# Patient Record
Sex: Male | Born: 1970 | Race: White | Hispanic: No | Marital: Married | Smoking: Never smoker
Health system: Southern US, Community
[De-identification: ages and names within clinical notes are randomized; demographics above are authoritative.]

## PROBLEM LIST (undated history)

## (undated) DIAGNOSIS — M109 Gout, unspecified: Secondary | ICD-10-CM

## (undated) DIAGNOSIS — N200 Calculus of kidney: Secondary | ICD-10-CM

## (undated) HISTORY — DX: Gout, unspecified: M10.9

---

## 2004-02-20 ENCOUNTER — Emergency Department: Payer: Self-pay

## 2004-04-16 ENCOUNTER — Ambulatory Visit: Payer: Self-pay

## 2004-08-02 ENCOUNTER — Ambulatory Visit: Payer: Self-pay

## 2005-11-17 ENCOUNTER — Ambulatory Visit: Payer: Self-pay

## 2006-11-24 ENCOUNTER — Emergency Department: Payer: Self-pay

## 2007-09-05 ENCOUNTER — Emergency Department: Payer: Self-pay

## 2007-12-23 ENCOUNTER — Emergency Department: Payer: Self-pay

## 2010-01-01 ENCOUNTER — Ambulatory Visit (INDEPENDENT_AMBULATORY_CARE_PROVIDER_SITE_OTHER): Payer: BC Managed Care – PPO | Admitting: CITY URGENT CARE-WVUHE

## 2010-09-23 ENCOUNTER — Ambulatory Visit (INDEPENDENT_AMBULATORY_CARE_PROVIDER_SITE_OTHER): Payer: BC Managed Care – PPO

## 2010-09-23 ENCOUNTER — Encounter (INDEPENDENT_AMBULATORY_CARE_PROVIDER_SITE_OTHER): Payer: Self-pay

## 2010-09-23 VITALS — BP 152/95 | HR 88 | Temp 98.2°F | Resp 16 | Ht 76.5 in | Wt 262.0 lb

## 2010-09-23 DIAGNOSIS — J069 Acute upper respiratory infection, unspecified: Secondary | ICD-10-CM

## 2010-09-23 DIAGNOSIS — R030 Elevated blood-pressure reading, without diagnosis of hypertension: Secondary | ICD-10-CM

## 2010-09-23 MED ORDER — AZITHROMYCIN 250 MG TABLET
ORAL_TABLET | ORAL | Status: AC
Start: 2010-09-23 — End: 2010-09-28

## 2012-02-10 ENCOUNTER — Ambulatory Visit (INDEPENDENT_AMBULATORY_CARE_PROVIDER_SITE_OTHER): Admit: 2012-02-10 | Disposition: A | Discharge status: Home / Self Care | Payer: Self-pay | Source: Ambulatory Visit

## 2012-10-20 ENCOUNTER — Emergency Department: Payer: BC Managed Care – PPO

## 2012-10-20 ENCOUNTER — Emergency Department (HOSPITAL_BASED_OUTPATIENT_CLINIC_OR_DEPARTMENT_OTHER)
Admission: EM | Admit: 2012-10-20 | Discharge: 2012-10-20 | Disposition: A | Discharge status: Home / Self Care | Payer: BC Managed Care – PPO | Attending: Emergency Medicine | Admitting: Emergency Medicine

## 2012-10-20 ENCOUNTER — Encounter (HOSPITAL_BASED_OUTPATIENT_CLINIC_OR_DEPARTMENT_OTHER): Payer: Self-pay

## 2012-10-20 DIAGNOSIS — S62639A Displaced fracture of distal phalanx of unspecified finger, initial encounter for closed fracture: Secondary | ICD-10-CM | POA: Insufficient documentation

## 2012-10-20 DIAGNOSIS — S61209A Unspecified open wound of unspecified finger without damage to nail, initial encounter: Secondary | ICD-10-CM | POA: Insufficient documentation

## 2012-10-20 DIAGNOSIS — W3189XA Contact with other specified machinery, initial encounter: Secondary | ICD-10-CM | POA: Insufficient documentation

## 2012-10-20 DIAGNOSIS — S62639B Displaced fracture of distal phalanx of unspecified finger, initial encounter for open fracture: Secondary | ICD-10-CM | POA: Insufficient documentation

## 2012-10-20 DIAGNOSIS — Z23 Encounter for immunization: Secondary | ICD-10-CM | POA: Insufficient documentation

## 2012-10-20 HISTORY — DX: Calculus of kidney: N20.0

## 2012-10-20 MED ORDER — DIPHTH,PERTUSSIS(ACELL),TETANUS 2.5 LF UNIT-8 MCG-5 LF/0.5 ML IM SUSP
0.5000 mL | INTRAMUSCULAR | Status: AC
Start: 2012-10-20 — End: 2012-10-20
  Administered 2012-10-20: 0.5 mL via INTRAMUSCULAR
  Filled 2012-10-20: qty 0.5

## 2012-10-20 MED ORDER — CEPHALEXIN 500 MG CAPSULE
500.00 mg | ORAL_CAPSULE | ORAL | Status: AC
Start: 2012-10-20 — End: 2012-10-20

## 2012-10-20 MED ORDER — CEPHALEXIN 500 MG CAPSULE
ORAL_CAPSULE | ORAL | Status: AC
Start: 2012-10-20 — End: 2012-10-20
  Administered 2012-10-20: 500 mg via ORAL
  Filled 2012-10-20: qty 1

## 2012-10-20 MED ORDER — HYDROCODONE 5 MG-ACETAMINOPHEN 325 MG TABLET
ORAL_TABLET | ORAL | Status: AC
Start: 2012-10-20 — End: 2012-10-20
  Administered 2012-10-20: 1 via ORAL
  Filled 2012-10-20: qty 1

## 2012-10-20 MED ORDER — CEPHALEXIN 500 MG CAPSULE
500.00 mg | ORAL_CAPSULE | Freq: Four times a day (QID) | ORAL | Status: AC
Start: 2012-10-20 — End: 2012-10-30

## 2012-10-20 MED ORDER — HYDROCODONE 5 MG-ACETAMINOPHEN 325 MG TABLET
1.00 | ORAL_TABLET | Freq: Three times a day (TID) | ORAL | Status: DC | PRN
Start: 2012-10-20 — End: 2013-02-18

## 2012-10-20 MED ORDER — HYDROCODONE 5 MG-ACETAMINOPHEN 325 MG TABLET
1.00 | ORAL_TABLET | ORAL | Status: AC
Start: 2012-10-20 — End: 2012-10-20

## 2012-10-20 NOTE — Discharge Instructions (Signed)
Fingertip Laceration  The treatment of fingertip injuries depends on how large the cut is and whether the bone or nail tissue has been damaged. Amputations of the skin over the tip of the finger that is smaller than a dime (smaller than 1cm) will usually heal very well from the sides without any treatment other than cleaning the wound and changing the dressing.  Keep your hand elevated for the next 2 to 3 days to reduce pain and swelling. A splint over the fingertip may be needed to protect your injury. If your cut is being allowed to heal in from the sides, you should soak it in warm water and change the dressing daily.   You may need a tetanus shot if:   You cannot remember when you had your last tetanus shot.   You have never had a tetanus shot.   The injury broke your skin.  If you got a tetanus shot, your arm may swell, get red, and feel warm to the touch. This is common and not a problem. If you need a tetanus shot and you choose not to have one, there is a rare chance of getting tetanus. Sickness from tetanus can be serious.  SEEK MEDICAL CARE IF:    There are any signs of infection: increased redness, swelling, and pain, or sometimes pus drainage.  Document Released: 12/07/2004 Document Revised: 01/22/2012 Document Reviewed: 12/03/2008  [facility] Patient Information 2013 [place].  Finger Fracture  Fractures of fingers are breaks in the bones of the fingers. There are many types of fractures. There are different ways of treating these fractures, all of which can be correct. Your caregiver will discuss the best way to treat your fracture.  TREATMENT   Finger fractures can be treated with:    Non-reduction - this means the bones are in place. The finger is splinted without changing the positions of the bone pieces. The splint is usually left on for about a week to ten days. This will depend on your fracture and what your caregiver thinks.   Closed reduction - the bones are put back into position  without using surgery. The finger is then splinted.   ORIF (open reduction and internal fixation) - the fracture site is opened. Then the bone pieces are fixed into place with pins or some type of hardware. This is seldom required. It depends on the severity of the fracture.  Your caregiver will discuss the type of fracture you have and the treatment that will be best for that problem. If surgery is the treatment of choice, the following is information for you to know and also let your caregiver know about prior to surgery.  LET YOUR CAREGIVER KNOW ABOUT:   Allergies   Medications taken including herbs, eye drops, over the counter medications, and creams   Use of steroids (by mouth or creams)   Previous problems with anesthetics or Novocaine   Possibility of pregnancy, if this applies   History of blood clots (thrombophlebitis)   History of bleeding or blood problems   Previous surgery   Other health problems  AFTER THE PROCEDURE  After surgery, you will be taken to the recovery area where a nurse will check your progress. Once you're awake, stable, and taking fluids well, barring other problems you will be allowed to go home. Once home an ice pack applied to your operative site may help with discomfort and keep the swelling down.  HOME CARE INSTRUCTIONS    Follow your caregiver's  instructions as to activities, exercises, physical therapy, and driving a car.   Use your finger and exercise as directed.   Only take over-the-counter or prescription medicines for pain, discomfort, or fever as directed by your caregiver. Do not take aspirin until your caregiver OK's it, as this can increase bleeding immediately following surgery.   Stop using ibuprofen if it upsets your stomach. Let your caregiver know about it.  SEEK MEDICAL CARE IF:   You have increased bleeding (more than a small spot) from the wound or from beneath your splint.   You develop redness, swelling, or increasing pain in the wound or from  beneath your splint.   There is pus coming from the wound or from beneath your splint.   An unexplained oral temperature above 102 F (38.9 C) develops, or as your caregiver suggests.   There is a foul smell coming from the wound or dressing or from beneath your splint.  SEEK IMMEDIATE MEDICAL CARE IF:    You develop a rash.   You have difficulty breathing.   You have any allergic problems.  MAKE SURE YOU:    Understand these instructions.   Will watch your condition.   Will get help right away if you are not doing well or get worse.  Document Released: 02/11/2001 Document Revised: 01/22/2012 Document Reviewed: 06/18/2008  [facility] Patient Information 2013 [place].

## 2012-10-20 NOTE — ED Nurses Note (Signed)
xlg sling applied with instruction of use

## 2012-10-20 NOTE — ED Nurses Note (Signed)
Vaccination info and vaccine card given to pt.

## 2012-10-20 NOTE — ED Nurses Note (Signed)
Pt to x ray with ns soaked 4x4 to area after block done by PA.

## 2012-10-20 NOTE — ED Nurses Note (Signed)
Written and verbal discharge instructions given to pt, they verbalize understanding. Pt ambulated to the exit.

## 2013-02-18 ENCOUNTER — Encounter (HOSPITAL_BASED_OUTPATIENT_CLINIC_OR_DEPARTMENT_OTHER): Payer: Self-pay

## 2013-02-18 ENCOUNTER — Emergency Department (HOSPITAL_BASED_OUTPATIENT_CLINIC_OR_DEPARTMENT_OTHER)
Admission: EM | Admit: 2013-02-18 | Discharge: 2013-02-18 | Disposition: A | Discharge status: Home / Self Care | Payer: Worker's Compensation | Attending: Emergency Medicine | Admitting: Emergency Medicine

## 2013-02-18 ENCOUNTER — Emergency Department (HOSPITAL_BASED_OUTPATIENT_CLINIC_OR_DEPARTMENT_OTHER): Payer: Worker's Compensation

## 2013-02-18 DIAGNOSIS — M7989 Other specified soft tissue disorders: Secondary | ICD-10-CM | POA: Insufficient documentation

## 2013-02-18 DIAGNOSIS — S52599A Other fractures of lower end of unspecified radius, initial encounter for closed fracture: Secondary | ICD-10-CM | POA: Insufficient documentation

## 2013-02-18 DIAGNOSIS — X500XXA Overexertion from strenuous movement or load, initial encounter: Secondary | ICD-10-CM | POA: Insufficient documentation

## 2013-02-18 NOTE — ED Nurses Note (Signed)
 Called RAD. Not ready for pt at this time.

## 2013-02-18 NOTE — ED Nurses Note (Signed)
 Injured lt. Wrist getting out of his truck

## 2013-02-18 NOTE — ED Nurses Note (Signed)
Pt injured left wrist, c/o pain to dorsal aspect of left wrist, xray completed, ed tech to apply volar splint

## 2013-02-18 NOTE — ED Nurses Note (Signed)
Patient discharged home with family.  AVS reviewed with patient/care giver.  A written copy of the AVS and discharge instructions was given to the patient/care giver.  Questions sufficiently answered as needed.  Patient/care giver encouraged to follow up with PCP as indicated.  In the event of an emergency, patient/care giver instructed to call 911 or go to the nearest emergency room.   Volar splint applied, reviewed instructions with pt for follow up, pt verbalized understanding of instructions, pt able to move fingers with splint applied no loss of sensation or circulation evident. Pt ambulatory from dept

## 2013-02-18 NOTE — ED Provider Notes (Signed)
 Janus Ozell RAMAN, MD  [facility]  Emergency Department Visit Note    Date:  02/18/2013  Primary care provider:  None Given  Means of arrival:  private car  History obtained from: patient  History limited by: none    Chief Complaint:  Wrist injury    HISTORY OF PRESENT ILLNESS     Benjamin Delgado, date of birth 1971/04/12, is a 42 y.o. male who presents to the Emergency Department post wrist injury. The patient states he was getting out of his work truck, and injured his left wrist. He states while climbing out of his truck I caught my left hand behind me, and they bent backwards and pulled my arm. Honestly, I have no idea how I did it. He states he is now experiencing pain and swelling to his left wrist and hand. He states he is right hand dominant.     REVIEW OF SYSTEMS     The pertinent positive and negative symptoms are as per HPI. All other systems reviewed and are negative.     PATIENT HISTORY     Past Medical History:  Past Medical History   Diagnosis Date   . Kidney stone        Past Surgical History:  History reviewed. No pertinent past surgical history.    Family History:  No family history of acute illness pertaining to the current visit given at this time.     Social History:  History   Substance Use Topics   . Smoking status: Never Smoker    . Smokeless tobacco: Not on file   . Alcohol Use: No     History   Drug Use No       Medications:  Previous Medications    No medications on file       Allergies:  Allergies   Allergen Reactions   . Tetanus Vaccines & Toxoid Nausea/ Vomiting       PHYSICAL EXAM     Vitals:  Filed Vitals:    02/18/13 0558   BP: 131/90   Pulse: 80   Temp: 36.5 C (97.7 F)   Resp: 16   SpO2: 99%       Pulse ox  99% on None (Room Air) interpreted by me as: Normal    Constitutional: The patient is alert and oriented to person, place, and time. Well-developed and well-nourished.  HENT: Atraumatic, normocephalic head. Mucous membranes moist. TM's clear, Nares  unremarkable. Oropharynx shows no erythema or exudate.   Eyes: Pupils equal and round, reactive to light. No scleral icterus. Normal conjunctiva. Extraocular movements are intact.  Neck: Supple, non-tender, no nuchal rigidity, no adenopathy.   Lungs: Clear to auscultation bilaterally. Symmetric and equal expansion. No respiratory distress or retractions.  Cardiovascular: Heart is S1-S2 regular rate and regular rhythm without murmur click or rub.  Abdomen:  Soft, non-distended. No tenderness to palpation without evidence of rebound or guarding. No pulsatile masses. No organomegaly.   Genitourinary: No CVA tenderness.  Extremities: Full range of motion, no clubbing, cyanosis, or edema. Pulses 2+, capillary refill <2 seconds. Tenderness to palpation of the dorsum of his left wrist, worse with pronation, supination, and hand extension. Neurovascularly intact.   Spine: No midline or paraspinal muscle tenderness to palpation. No step-off.   Skin: Warm and dry. No cyanosis, jaundice, rash or lesion.  Neurologic: Alert and oriented x3. Normal facial symmetry and speech, Normal upper and lower extremity strength, and grossly normal sensation.  DIAGNOSTIC STUDIES     Radiology:    XR WRIST LEFT SERIES, 5-VIEWS: Questionable lucency on the distal radial head.   Interpreted by me.    ED PROGRESS NOTE / MEDICAL DECISION MAKING     Old records reviewed by me:  I have reviewed the patient's relevant previous records.       Orders Placed This Encounter   . XR WRIST LEFT SERIES       Left wrist x-ray series ordered.    Differential diagnosis: fracture vs dislocation vs contusion vs muscle strain.     6:21 AM - I have explained the results of the diagnostic studies that revealed questionable lucency on the distal radial head. He understands the possibility that these findings may or may not represent a fracture, and is comfortable treating his injury as if it were a fracture. The patient had a volar splint applied to his left  wrist. I have discussed the diagnosis, disposition, and follow-up plan. Return precautions to the Emergency Department were discussed. He understood and is in accordance with the treatment plan at this time. All of his questions have been answered to his satisfaction. The patient is in stable condition at the time of discharge.     6:34 AM Volar splint adequately applied by Reynolds B. (Tech). The patient is neurovascularly intact.        Pre-Disposition Vitals:  Filed Vitals:    02/18/13 0558   BP: 131/90   Pulse: 80   Temp: 36.5 C (97.7 F)   Resp: 16   SpO2: 99%       CLINICAL IMPRESSION     Encounter Diagnosis   Name Primary?   . Distal radius fracture Yes     DISPOSITION/PLAN     Discharged        Prescriptions:     No medications were prescribed during this visit.      Follow-Up:     Arvell Norleen LABOR, MD  [address]  [place] [id number]  [phone]    Call in 2 days        Condition at Disposition: Stable        SCRIBE ATTESTATION STATEMENT  I Ray Chandler, SCRIBE scribed for Janus Ozell RAMAN, MD on 02/18/2013 at 6:17 AM.     Documentation assistance provided for Janus Ozell RAMAN, MD  by Ray Chandler, SCRIBE. Information recorded by the scribe was done at my direction and has been reviewed and validated by me Janus Ozell RAMAN, MD.

## 2013-03-03 ENCOUNTER — Ambulatory Visit: Admit: 2013-03-03 | Payer: Self-pay | Source: Ambulatory Visit | Attending: Family Medicine | Admitting: Family Medicine

## 2013-03-03 ENCOUNTER — Ambulatory Visit
Admission: RE | Admit: 2013-03-03 | Disposition: A | Discharge status: Home / Self Care | Payer: Self-pay | Source: Ambulatory Visit | Admitting: Diagnostic Radiology

## 2013-03-04 ENCOUNTER — Ambulatory Visit
Admit: 2013-03-04 | Disposition: A | Discharge status: Home / Self Care | Payer: Self-pay | Source: Ambulatory Visit | Admitting: Family Medicine

## 2014-06-10 ENCOUNTER — Ambulatory Visit
Admission: RE | Admit: 2014-06-10 | Discharge: 2014-06-10 | Disposition: A | Discharge status: Home / Self Care | Payer: BC Managed Care – PPO | Source: Ambulatory Visit | Attending: Internal Medicine | Admitting: Internal Medicine

## 2014-06-10 DIAGNOSIS — M109 Gout, unspecified: Secondary | ICD-10-CM | POA: Insufficient documentation

## 2014-06-10 LAB — COMPREHENSIVE METABOLIC PROFILE - BMC/JMC ONLY
ALBUMIN/GLOBULIN RATIO: 1.6
ALBUMIN: 4.7 g/dL (ref 3.2–5.0)
ALKALINE PHOSPHATASE: 56 IU/L (ref 35–120)
ALT (SGPT): 35 IU/L (ref 0–63)
ANION GAP: 11 mmol/L (ref 3–11)
AST (SGOT): 28 IU/L (ref 0–45)
BILIRUBIN, TOTAL: 0.6 mg/dL (ref 0.0–1.3)
BUN: 16 mg/dL (ref 6–22)
CALCIUM: 10 mg/dL (ref 8.5–10.5)
CARBON DIOXIDE: 26 mmol/L (ref 22–32)
CHLORIDE: 104 mmol/L (ref 101–111)
CREATININE: 1.01 mg/dL (ref 0.72–1.30)
ESTIMATED GLOMERULAR FILTRATION RATE: >60 mL/min (ref >60)
GLUCOSE: 104 mg/dL (ref 70–110)
POTASSIUM: 4.7 mmol/L (ref 3.5–5.0)
SODIUM: 141 mmol/L (ref 136–145)
TOTAL PROTEIN: 7.5 g/dL (ref 6.0–8.0)

## 2014-06-10 LAB — URIC ACID: URIC ACID: 6.3 mg/dL (ref 4.0–7.0)

## 2014-09-21 ENCOUNTER — Encounter (HOSPITAL_BASED_OUTPATIENT_CLINIC_OR_DEPARTMENT_OTHER): Payer: Self-pay

## 2014-09-21 ENCOUNTER — Emergency Department (HOSPITAL_BASED_OUTPATIENT_CLINIC_OR_DEPARTMENT_OTHER)
Admission: EM | Admit: 2014-09-21 | Discharge: 2014-09-21 | Disposition: A | Discharge status: Home / Self Care | Payer: BC Managed Care – PPO | Attending: Emergency Medicine | Admitting: Emergency Medicine

## 2014-09-21 DIAGNOSIS — W57XXXA Bitten or stung by nonvenomous insect and other nonvenomous arthropods, initial encounter: Secondary | ICD-10-CM | POA: Insufficient documentation

## 2014-09-21 DIAGNOSIS — S70362A Insect bite (nonvenomous), left thigh, initial encounter: Secondary | ICD-10-CM | POA: Insufficient documentation

## 2014-09-21 MED ORDER — DOXYCYCLINE HYCLATE 100 MG TABLET
200.00 mg | ORAL_TABLET | ORAL | Status: AC
Start: 2014-09-21 — End: 2014-09-21

## 2014-09-21 MED ORDER — DOXYCYCLINE HYCLATE 100 MG TABLET
ORAL_TABLET | ORAL | Status: AC
Start: 2014-09-21 — End: 2014-09-21
  Administered 2014-09-21: 200 mg via ORAL
  Filled 2014-09-21: qty 2

## 2014-09-21 MED ADMIN — nystatin 100,000 unit/gram topical powder: ORAL | @ 02:00:00 | NDC 00574200815

## 2014-09-21 NOTE — ED Nurses Note (Signed)
Pt complaining of a tick bite to his left thigh.

## 2014-09-21 NOTE — ED Nurses Note (Signed)
Current Discharge Medication List      Notice     You have not been prescribed any medications.      Patient discharged home with family.  AVS reviewed with patient/care giver.  A written copy of the AVS and discharge instructions was given to the patient/care giver.  Questions sufficiently answered as needed.  Patient/care giver encouraged to follow up with PCP as indicated.  In the event of an emergency, patient/care giver instructed to call 911 or go to the nearest emergency room.   Pt in NAD and has equal and unlabored respirations.  Pt ambulated to lobby by self

## 2014-09-21 NOTE — ED Provider Notes (Signed)
Kara PacerLondner, Kylie Gros S, MD  [facility]  Emergency Department Visit Note    Date:  09/21/2014  Primary care provider:  No Established Pcp  Means of arrival:  private car  History obtained from: patient  History limited by: none    Chief Complaint:  Tick bite     HISTORY OF PRESENT ILLNESS     Benjamin Delgado, date of birth 12/16/1970, is a 43 y.o. male who presents to the Emergency Department post tick bite. The patient states that Saturday evening (09/19/14) he was bit on the left thigh by a tick. He states "it was one of the little ones you're supposed to look out for, and it left a red bullseye spot where I was bit, and I wanted to come have it looked out." He denies any headaches, joint pain, or other symptoms at this time.     REVIEW OF SYSTEMS     The pertinent positive and negative symptoms are as per HPI. All other systems reviewed and are negative.     PATIENT HISTORY     Past Medical History:  Past Medical History   Diagnosis Date    Kidney stone        Past Surgical History:  History reviewed. No pertinent past surgical history.    Family History:  No family history of acute illness pertaining to the current visit given at this time.     Social History:  History   Substance Use Topics    Smoking status: Never Smoker     Smokeless tobacco: Not on file    Alcohol Use: No     History   Drug Use No       Medications:  Previous Medications    No medications on file       Allergies:  Allergies   Allergen Reactions    Tetanus Vaccines & Toxoid Nausea/ Vomiting       PHYSICAL EXAM     Vitals:  Filed Vitals:    09/21/14 0127   BP: 156/89   Pulse: 75   Temp: 36.7 C (98.1 F)   Resp: 16   SpO2: 99%       Pulse ox  99% on None (Room Air) interpreted by me as: Normal    Constitutional: The patient is alert and oriented to person, place, and time. Well-developed and well-nourished.  HENT: Atraumatic, normocephalic head. Mucous membranes moist. Nares unremarkable. Oropharynx shows no erythema or exudate.      Eyes: Pupils equal and round, reactive to light. No scleral icterus. Normal conjunctiva. Extraocular movements are intact.  Neck: Supple, non-tender, no nuchal rigidity, no adenopathy.   Lungs: Clear to auscultation bilaterally. Symmetric and equal expansion. No respiratory distress or retractions.  Cardiovascular: Heart is S1-S2 regular rate and regular rhythm without murmur click or rub.  Abdomen:  Soft, non-distended. No tenderness to palpation without evidence of rebound or guarding. No pulsatile masses. No organomegaly.   Extremities: Full range of motion, no clubbing, cyanosis, or edema. Pulses 2+, capillary refill <2 seconds.  Spine: No midline or paraspinal muscle tenderness to palpation. No step-off.   Skin: Warm and dry. No cyanosis, jaundice, rash or lesion. Small tick bite on the left lateral thigh.   Neurologic: Alert and oriented x3. Normal facial symmetry and speech, Normal upper and lower extremity strength, and grossly normal sensation.     DIFFERENTIAL DIAGNOSES      Tick bite    Cellulitis    Lyme exposure  ED PROGRESS NOTE / MEDICAL DECISION MAKING     Old records reviewed by me:  I have reviewed the patient's recent past medical history. Nurse's notes reviewed.      Orders Placed This Encounter    doxycycline tablet       Patient was initially treated with Doxycycline PO.      1:46 AM - Initial evaluation completed at this time.     1:47 AM - I have discussed the diagnosis, disposition, and follow-up plan. Return precautions to the Emergency Department including: red streaking from the bite, headache, or joint pain were discussed. The patient understood and is in accordance with the treatment plan at this time. All of his questions have been answered to his satisfaction. The patient is in stable condition at the time of discharge.         Pre-Disposition Vitals:  Filed Vitals:    09/21/14 0127   BP: 156/89   Pulse: 75   Temp: 36.7 C (98.1 F)   Resp: 16   SpO2: 99%       CLINICAL  IMPRESSION     Encounter Diagnosis   Name Primary?    Tick bite of thigh, left, initial encounter Yes     DISPOSITION/PLAN     Discharged        Prescriptions:     No medications were prescribed during this visit.      [facility]Up:     [facility]  [address]  [place] [id number]  [phone]    As needed      Condition at Disposition: Stable        SCRIBE ATTESTATION STATEMENT  I Micah FlesherSimon Colie, SCRIBE scribed for Kara PacerLondner, Terrea Bruster S, MD on 09/21/2014 at 1:43 AM.     Documentation assistance provided for Kara PacerLondner, Tamel Abel S, MD  by Micah FlesherSimon Colie, SCRIBE. Information recorded by the scribe was done at my direction and has been reviewed and validated by me Kara PacerLondner, Nylan Nakatani S, MD.

## 2014-12-27 ENCOUNTER — Encounter (INDEPENDENT_AMBULATORY_CARE_PROVIDER_SITE_OTHER): Payer: Self-pay | Admitting: Physician Assistant

## 2014-12-27 ENCOUNTER — Ambulatory Visit (INDEPENDENT_AMBULATORY_CARE_PROVIDER_SITE_OTHER): Payer: BLUE CROSS/BLUE SHIELD | Admitting: Physician Assistant

## 2014-12-27 VITALS — BP 140/89 | HR 80 | Temp 98.0°F | Resp 18 | Ht 76.0 in | Wt 271.5 lb

## 2014-12-27 DIAGNOSIS — H6692 Otitis media, unspecified, left ear: Secondary | ICD-10-CM

## 2014-12-27 DIAGNOSIS — J029 Acute pharyngitis, unspecified: Secondary | ICD-10-CM

## 2014-12-27 DIAGNOSIS — H6092 Unspecified otitis externa, left ear: Secondary | ICD-10-CM

## 2014-12-27 LAB — POCT RAPID STREP A: Rapid Strep A Screen POCT: NEGATIVE

## 2014-12-27 MED ORDER — NEOMYCIN-POLYMYXIN-HC 3.5-10000-1 OT SOLN
3.0000 [drp] | Freq: Four times a day (QID) | OTIC | Status: AC
Start: 2014-12-27 — End: ?

## 2014-12-27 MED ORDER — AZITHROMYCIN 250 MG PO TABS
ORAL_TABLET | ORAL | Status: DC
Start: 2014-12-27 — End: 2018-02-28

## 2014-12-27 NOTE — Progress Notes (Signed)
Subjective:    Patient ID: Melvin Welch is a 44 y.o. male.    Otalgia   There is pain in the left ear. This is a chronic problem. The current episode started more than 1 month ago. The problem occurs constantly. The problem has been unchanged. There has been no fever. The pain is moderate. Associated symptoms include a sore throat. Pertinent negatives include no coughing, hearing loss, rash or rhinorrhea. He has tried antibiotics (has tried two diff abx amoxicillin and augmentin from [facility]) for the symptoms.     Allergies   Allergen Reactions   . Aspirin Nausea And Vomiting       No past medical history on file.    No current outpatient prescriptions on file prior to visit.     No current facility-administered medications on file prior to visit.       The following portions of the patient's history were reviewed and updated as appropriate: allergies, current medications, past family history, past medical history, past social history, past surgical history and problem list.    Review of Systems   Constitutional: Negative for fever.   HENT: Positive for ear pain and sore throat. Negative for hearing loss and rhinorrhea.    Respiratory: Negative for cough, chest tightness, wheezing and stridor.    Cardiovascular: Negative for chest pain.   Skin: Negative for rash.         Objective:    BP 140/89 mmHg  Pulse 80  Temp(Src) 98 F (36.7 C) (Oral)  Resp 18  Ht 1.93 m (6\' 4" )  Wt 123.152 kg (271 lb 8 oz)  BMI 33.06 kg/m2    Vital signs were checked and discussed with the patient. Appropriate measures have been taken. The patient will follow up with primary care physician for further diagnostic evaluation and treatment if there are any concerns.     Physical Exam   Constitutional: He appears well-developed and well-nourished. No distress.   HENT:   Head: Normocephalic and atraumatic.   Right Ear: Tympanic membrane and ear canal normal.   Left Ear: Ear canal normal. There is tenderness.    Ears:    Mouth/Throat:       Neck:   No cervical LN swelling.    Cardiovascular: Normal rate.    Pulmonary/Chest: Effort normal.   Neurological: He is alert.   Psychiatric: He has a normal mood and affect.   Nursing note and vitals reviewed.    Lab Results from today's visit:  Recent Results (from the past 12 hour(s))   POCT RAPID STREP A    Collection Time: 12/27/14 12:00 PM   Result Value Ref Range    POCT QC Pass     Rapid Strep A Screen POCT Negative  Negative    Comment       Negative Results should be confirmed by throat Cx to confirm absence of Strep A inf.       Radiology Results from today's visit:  No results found.        Assessment and Plan:       [name] was seen today for otalgia and sore throat.    Diagnoses and all orders for this visit:    Left otitis media, unspecified otitis media type: since left ear pain has lasted > 1 mo, ENT referral given if not getting better. PE is not significant at this point.   Orders:  -     azithromycin (ZITHROMAX Z-PAK) 250 MG tablet; Take two tablets  on day one and take one tablet from day 2 to day 5.    Pharyngitis  Orders:  -     POCT RAPID STREP A    Otitis externa of left ear, unspecified chronicity, unspecified type  Orders:  -     neomycin-polymyxin-hydrocortisone (CORTISPORIN) otic solution; Place 3 drops into the left ear 4 (four) times daily.      Please go to ER if symptoms persist, worsen or new symptoms develop. Follow up with your Primary Care Physician or Return to Clinic as needed. Patient/Family verbalizes understanding.            Danella Deis, PA  [facility]  12/27/2014  12:19 PM

## 2014-12-27 NOTE — Patient Instructions (Signed)
Please go to ER if symptoms persist, worsen or new symptoms develop. Follow up with your Primary Care Physician or Return to Clinic as needed. Patient/Family verbalizes understanding.    Alric Seton, MD  [address], [address], [place], [place] [address]  Phone: [phone]; Fax: [phone]      External Ear Infection (Adult)    External otitis (also called "swimmer's ear")is an infection in the ear canal. It is often caused by bacteria or fungus. It can occur a few days after water gets trapped in the ear canal (fromswimming or bathing). It can also occur after cleaning too deeply in the ear canal with a cotton swab or other object. Sometimes, hair care products get into the ear canal and cause this problem.  Symptoms can include pain,fever,itching, redness, drainage, or swelling of the ear canal. Temporary hearing loss may also occur.  Home care   Do not try to clean the ear canal. This can push pus and bacteria deeper into the canal.   Use prescribed ear drops as directed. These help reduce swelling and fight the infection. If an ear wick was placed in the ear canal, apply drops right onto the end of the wick. The wick will draw the medication into the ear canal even if it is swollen closed.   A cotton ball may be loosely placed in the outer ear to absorb any drainage.   You may use acetaminophen or ibuprofen to control pain, unless another medication was prescribed. Note: If you have chronic liver or kidney disease or ever had a stomach ulcer or GI bleeding, talk to your health care provider before taking any of these medications.   Do not allow water to get into your ear when bathing. Also, avoid swimming until the infection has cleared.  Prevention   Keep your ears dry. This helps lower the risk of infection. Dry your ears with a towel or hair dryer after getting wet. Also, use ear plugs when swimming.   Do not stick any objects in the ear to remove wax.   If you feel water trapped in your  ear, use ear drops right away. You can get these drops over the counter at most drugstores. They work by removing water from the ear canal.  Follow-up care  Follow up with your health care provider in one week, or as advised.  When to seek medical advice  Call your health care provider right away if any of these occur:   Ear pain becomes worse or doesn't improve after 3 days of treatment   Redness or swelling of the outer ear occurs or gets worse   Headache   Painful or stiff neck   Drowsiness or confusion   Fever of 100.34F (38C) or higher, or as directed by your health care provider   Seizure   2000-2015 [employer] [place]. [address], [place], [place] [address]. All rights reserved. This information is not intended as a substitute for professional medical care. Always follow your healthcare professional's instructions.

## 2014-12-30 ENCOUNTER — Telehealth (INDEPENDENT_AMBULATORY_CARE_PROVIDER_SITE_OTHER): Payer: Self-pay

## 2014-12-30 NOTE — Telephone Encounter (Signed)
Courtesy call made to f/u on pt's recent visit. Spoke to pt who stated " his ear is feeling a lot better". Pt denies any further questions and/or concerns at this time.

## 2015-08-03 ENCOUNTER — Ambulatory Visit (INDEPENDENT_AMBULATORY_CARE_PROVIDER_SITE_OTHER): Payer: BC Managed Care – PPO | Admitting: Internal Medicine

## 2015-08-03 VITALS — BP 152/92 | HR 98 | Temp 96.7°F | Ht 76.0 in | Wt 259.0 lb

## 2015-08-03 DIAGNOSIS — M109 Gout, unspecified: Principal | ICD-10-CM

## 2015-08-03 MED ORDER — INDOMETHACIN 25 MG CAPSULE
50.00 mg | ORAL_CAPSULE | Freq: Three times a day (TID) | ORAL | Status: DC
Start: 2015-08-03 — End: 2019-03-25

## 2015-08-03 NOTE — Progress Notes (Signed)
BP 152/92 mmHg   Pulse 98   Temp(Src) 35.9 C (96.7 F)   Ht 1.93 m ( )   Wt 117.482 kg (259 lb)   BMI 31.54 kg/m2  Ranae Pila, MA  08/03/2015, 15:42

## 2015-08-03 NOTE — Progress Notes (Signed)
[facility]   [address]   [place], [place] [address]  [phone]  [phone])    Return Visit    ID: Benjamin Delgado   DOB: 06/23/1971  Date of Service: 08/03/2015     Chief Complaint(s):   Chief Complaint   Patient presents with    Gout     flare up       SUBJECTIVE:  Sudden onset of severe pain at the top of the foot just below the ankle on the left side overnight. Took 1600 mg of ibuprofen this morning with much improvement but still having pain.  Patient had acute arthritis in the left great toe one year ago resolving with prednisone. Uric acid at that time was 6.3. Was thought to be either acute gout or pseudogout.    Past Medical History:  There are no active problems to display for this patient.      Medications:  Current Outpatient Prescriptions   Medication Sig    Ibuprofen (MOTRIN) 200 mg Oral Tablet Take 200 mg by mouth Once a day    indomethacin (INDOCIN) 25 mg Oral Capsule Take 2 Caps (50 mg total) by mouth Three times a day        Allergies:  Allergies   Allergen Reactions    Tetanus Vaccines & Toxoid Nausea/ Vomiting        Social History:  Social History   Substance Use Topics    Smoking status: Never Smoker     Smokeless tobacco: Not on file    Alcohol Use: No          OBJECTIVE:  BP 152/92 mmHg   Pulse 98   Temp(Src) 35.9 Benjamin Delgado (96.7 F)   Ht 1.93 m ( )   Wt 117.482 kg (259 lb)   BMI 31.54 kg/m2     Physical Exam:  Extremities: Pain with minimal swelling, no erythema over the talar-metatarsal bones on L foot        ASSESSMENT and PLAN:  1. Acute gout       Acute gout vs pseudogout  Indocin 25 mg 2 tabs tid for 3 days then slow taper over 1 wk.    ROV prn    Serita Butcher, MD 08/03/2015, 18:47      Visit Diagnoses and associated Orders -- Summary:        ICD-10-CM    1. Acute gout M10.9

## 2018-06-04 ENCOUNTER — Encounter (INDEPENDENT_AMBULATORY_CARE_PROVIDER_SITE_OTHER): Payer: Self-pay | Admitting: Internal Medicine

## 2019-03-25 ENCOUNTER — Other Ambulatory Visit: Payer: Self-pay

## 2019-03-25 ENCOUNTER — Ambulatory Visit (INDEPENDENT_AMBULATORY_CARE_PROVIDER_SITE_OTHER): Payer: Self-pay | Admitting: Family Medicine

## 2019-03-25 ENCOUNTER — Encounter (INDEPENDENT_AMBULATORY_CARE_PROVIDER_SITE_OTHER): Payer: Self-pay | Admitting: Family Medicine

## 2019-03-25 VITALS — BP 153/103 | HR 66 | Temp 97.7°F | Resp 16 | Ht 76.0 in | Wt 260.0 lb

## 2019-03-25 DIAGNOSIS — M109 Gout, unspecified: Secondary | ICD-10-CM

## 2019-03-25 MED ORDER — INDOMETHACIN 50 MG CAPSULE
50.00 mg | ORAL_CAPSULE | Freq: Three times a day (TID) | ORAL | 0 refills | Status: DC | PRN
Start: 2019-03-25 — End: 2019-04-03

## 2019-03-25 NOTE — Progress Notes (Signed)
URGENT CARE, [facility]  [address]  [place] [address]       Name: Benjamin Delgado MRN:  [id number]   Date: 03/25/2019 Age: 48 y.o.       Chief Complaint: Gout (big toe, left foot)      Subjective  Patient comes in with pain in his left great toe.  He has a history of gout attacks is feels very typical doing.  Started feel little bit better E took an indomethacin.  He is currently only got 1 indomethacin left.  This is his 3rd attack so far this year.  Usually was about once a year.  No fever chills nausea vomiting chest pain shortness of breath.  He does not have a primary care currently.    Current Outpatient Medications   Medication Sig   . Ibuprofen (MOTRIN) 200 mg Oral Tablet Take 200 mg by mouth Once a day   . indomethacin (INDOCIN) 50 mg Oral Capsule Take 1 Cap (50 mg total) by mouth Three times a day as needed     Allergies   Allergen Reactions   . Tetanus Vaccines And Toxoid Nausea/ Vomiting         Objective  Vitals: BP (!) 153/103   Pulse 66   Temp 36.5 C (97.7 F) (Oral)   Resp 16   Ht 1.93 m (6\' 4" )   Wt 118 kg (260 lb)   SpO2 98%   BMI 31.65 kg/m       General: no distress  Neck: supple, symmetrical, trachea midline  Cardiovascular:    Heart regular rate and rhythm  Extremities: no cyanosis or edema and left great toe with erythema and swelling at base.    Assessment/Plan  1. Gout, unspecified cause, unspecified chronicity, unspecified site        Orders Placed This Encounter   . indomethacin (INDOCIN) 50 mg Oral Capsule        Prescription for indomethacin given.  Strongly recommend he follow up with and establish with a primary care physician    Sunday Corn, MD

## 2019-03-27 ENCOUNTER — Telehealth (INDEPENDENT_AMBULATORY_CARE_PROVIDER_SITE_OTHER): Payer: Self-pay | Admitting: Family Medicine

## 2019-03-27 NOTE — Telephone Encounter (Signed)
Patient seen in Urgent Care two days ago. Called patient, who stated that he is still have some gout symptoms, but they have improved. Patient also stated that he is going to make an appointment with a PCP to follow this. Patient had no further questions or concerns at this time.

## 2019-04-03 ENCOUNTER — Ambulatory Visit (INDEPENDENT_AMBULATORY_CARE_PROVIDER_SITE_OTHER): Payer: Self-pay | Admitting: FAMILY MEDICINE

## 2019-04-03 ENCOUNTER — Other Ambulatory Visit: Payer: Self-pay

## 2019-04-03 VITALS — BP 135/93 | HR 92 | Temp 98.3°F | Ht 76.0 in | Wt 262.0 lb

## 2019-04-03 DIAGNOSIS — M109 Gout, unspecified: Secondary | ICD-10-CM

## 2019-04-03 DIAGNOSIS — Z87442 Personal history of urinary calculi: Secondary | ICD-10-CM

## 2019-04-03 DIAGNOSIS — Z7689 Persons encountering health services in other specified circumstances: Secondary | ICD-10-CM

## 2019-04-03 DIAGNOSIS — Z0001 Encounter for general adult medical examination with abnormal findings: Secondary | ICD-10-CM

## 2019-04-03 MED ORDER — PREDNISONE 20 MG TABLET
20.0000 mg | ORAL_TABLET | Freq: Every day | ORAL | 0 refills | Status: AC
Start: 2019-04-03 — End: 2019-04-08

## 2019-04-03 NOTE — Progress Notes (Signed)
[facility]  [address]  [place] [id number]  Phone: [phone]  Fax: [phone]    Encounter Date: 04/03/2019    Patient ID:  Benjamin CheeksJamie Willard Delgado  [id number]:[id number]    DOB: 11/19/1970  Age: 48 y.o. male    Subjective:     Chief Complaint   Patient presents with   . Establish Care     This is a pleasant 48 year old male here for establish care.  Patient has not had a family physician for several years.  Patient reports a past medical history of gout, has been managing with indomethacin for the last several years.  He states that when this was 1st diagnosed about 5 years ago it responded very well to indomethacin.  However the last several years he has had only 1 or 2 attacks annually, this is increased over the last 1 year to 3 attacks.  Currently he is dealing with symptoms in his left toe, left wrist and right knee.  Patient states that he does not take indomethacin through the week because he works as a Naval architecttruck driver and this does make him a little bit "loopy".  He states that he does take it consistently on the weekends and is usually able to arrest the attacks however this most recent attack has been persistent longer than 1 week.  Patient reports that when he was initially diagnosed his uric last level was relatively low and he was not offered prophylactic treatment.  Given the patient's frequency of symptoms at this time he does likely qualify for prophylaxis but will go ahead and address uric acid level initially.  Will also not start allopurinol during an active attack at this time as this may exacerbate symptoms.  Will treat at this time with prednisone 40 mg p.o. Daily for 5 days.  Plan to see the patient back in 2 weeks to review labs including uric acid, CMP, lipid panel and will address prophylactic therapy at that time if the patient is not active.  Patient also reports history of renal calculi in the past, allopurinol may help this.        Current Outpatient Medications      Medication Sig   . predniSONE (DELTASONE) 20 mg Oral Tablet Take 1 Tab (20 mg total) by mouth Once a day for 5 days     Allergies   Allergen Reactions   . Tetanus Vaccines And Toxoid Nausea/ Vomiting     Past Medical History:   Diagnosis Date   . Gout    . Kidney stone                Family Medical History:     Problem Relation (Age of Onset)    Cancer Mother            Social History     Tobacco Use   . Smoking status: Never Smoker   . Smokeless tobacco: Never Used   Substance Use Topics   . Alcohol use: No   . Drug use: No       Review of Systems   Constitutional: Negative for appetite change and fever.   HENT: Negative for congestion and hearing loss.    Eyes: Negative for visual disturbance.   Respiratory: Negative for chest tightness and shortness of breath.    Cardiovascular: Negative for chest pain and leg swelling.   Gastrointestinal: Negative for blood in stool, diarrhea, nausea and vomiting.   Endocrine: Negative for polydipsia and polyuria.   Genitourinary:  Negative for dysuria and hematuria.   Musculoskeletal: Positive for arthralgias.   Skin: Negative for rash.   Neurological: Negative for seizures and headaches.     Objective:   Vitals: BP (!) 135/93   Pulse 92   Temp 36.8 C (98.3 F) (Oral)   Ht 1.93 m (6\' 4" )   Wt 119 kg (262 lb)   SpO2 98%   BMI 31.89 kg/m         Physical Exam  Vitals signs and nursing note reviewed.   Constitutional:       Appearance: He is well-developed.   HENT:      Head: Normocephalic and atraumatic.      Right Ear: Tympanic membrane normal.      Left Ear: Tympanic membrane normal.      Nose: No congestion.      Mouth/Throat:      Mouth: Mucous membranes are moist.      Pharynx: Oropharynx is clear. No oropharyngeal exudate.   Eyes:      Conjunctiva/sclera: Conjunctivae normal.      Pupils: Pupils are equal, round, and reactive to light.   Neck:      Musculoskeletal: Normal range of motion and neck supple.      Thyroid: No thyromegaly.      Vascular: No JVD.    Cardiovascular:      Rate and Rhythm: Normal rate and regular rhythm.      Heart sounds: No murmur. No friction rub. No gallop.    Pulmonary:      Effort: Pulmonary effort is normal. No respiratory distress.      Breath sounds: Normal breath sounds.   Abdominal:      General: Bowel sounds are normal.      Palpations: Abdomen is soft.      Tenderness: There is no abdominal tenderness. There is no guarding.   Musculoskeletal: Normal range of motion.   Lymphadenopathy:      Cervical: No cervical adenopathy.   Skin:     General: Skin is warm and dry.      Findings: No rash.   Neurological:      Mental Status: He is alert and oriented to person, place, and time.   Psychiatric:         Behavior: Behavior normal.         Assessment & Plan:     ENCOUNTER DIAGNOSES     ICD-10-CM   1. Encounter for well adult exam with abnormal findings Z00.01   2. Encounter to establish care Z76.89   3. Gout, unspecified cause, unspecified chronicity, unspecified site M10.9       Orders Placed This Encounter   . URIC ACID   . COMPREHENSIVE METABOLIC PNL, FASTING   . LIPID PANEL   . predniSONE (DELTASONE) 20 mg Oral Tablet     This is a 48 year old male with    Gout attack   Uric acid level   Start prednisone 40 mg p.o. Daily x5 days   Discontinue NSAIDs    Age-appropriate screening   CMP and lipid panel    Plan to see patient back in 2 weeks to review labs and consider prophylactic therapy.    Benjamin Lowers Fahmida Jurich, DO

## 2019-04-03 NOTE — Nursing Note (Signed)
Chief Complaint            Establish Care         PHQ Questionnaire        BP (!) 135/93   Pulse 92   Temp 36.8 C (98.3 F) (Oral)   Ht 1.93 m (6\' 4" )   Wt 119 kg (262 lb)   SpO2 98%   BMI 31.89 kg/m       [employer], MA

## 2019-04-03 NOTE — Patient Instructions (Signed)
Blood Pressure Record     Systolic  Diastolic     Date Day Time Pressure Pressure Heart Rate                                                                                                                                                                                                                                                                                                                                       Please record your blood pressures and heart rate twice daily for the next two weeks and return them to Dr. Milcah Dulany at [facility]

## 2019-04-04 ENCOUNTER — Encounter (INDEPENDENT_AMBULATORY_CARE_PROVIDER_SITE_OTHER): Payer: Self-pay | Admitting: FAMILY MEDICINE

## 2019-04-05 ENCOUNTER — Ambulatory Visit: Payer: BC Managed Care – PPO | Attending: FAMILY MEDICINE

## 2019-04-05 ENCOUNTER — Other Ambulatory Visit: Payer: Self-pay

## 2019-04-05 DIAGNOSIS — Z0001 Encounter for general adult medical examination with abnormal findings: Secondary | ICD-10-CM | POA: Insufficient documentation

## 2019-04-05 DIAGNOSIS — M109 Gout, unspecified: Secondary | ICD-10-CM | POA: Insufficient documentation

## 2019-04-05 DIAGNOSIS — Z7689 Persons encountering health services in other specified circumstances: Secondary | ICD-10-CM | POA: Insufficient documentation

## 2019-04-05 LAB — COMPREHENSIVE METABOLIC PNL, FASTING
ALBUMIN/GLOBULIN RATIO: 1.6 (ref 0.8–2.0)
ALBUMIN: 4.3 g/dL (ref 3.5–5.0)
ALKALINE PHOSPHATASE: 52 U/L (ref 38–126)
ALT (SGPT): 43 U/L (ref 17–63)
ANION GAP: 7 mmol/L (ref 3–11)
AST (SGOT): 27 U/L (ref 15–41)
BILIRUBIN TOTAL: 0.8 mg/dL (ref 0.3–1.2)
BUN/CREA RATIO: 17 (ref 6–22)
BUN: 19 mg/dL (ref 6–20)
CALCIUM: 9.7 mg/dL (ref 8.6–10.3)
CALCIUM: 9.7 mg/dL (ref 8.6–10.3)
CHLORIDE: 102 mmol/L (ref 101–111)
CO2 TOTAL: 28 mmol/L (ref 22–32)
CREATININE: 1.11 mg/dL (ref 0.61–1.24)
ESTIMATED GFR: >60 mL/min/1.73mˆ2 (ref >60)
GLUCOSE: 96 mg/dL (ref 70–110)
GLUCOSE: 96 mg/dL (ref 70–110)
POTASSIUM: 4.7 mmol/L (ref 3.4–5.1)
PROTEIN TOTAL: 7 g/dL (ref 6.4–8.3)
SODIUM: 137 mmol/L (ref 136–145)

## 2019-04-05 LAB — LIPID PANEL
CHOL/HDL RATIO: 6.4
CHOLESTEROL: 173 mg/dL (ref 120–199)
HDL CHOL: 27 mg/dL — ABNORMAL LOW (ref >39)
LDL CALC: 104 mg/dL (ref <=130)
TRIGLYCERIDES: 208 mg/dL — ABNORMAL HIGH (ref <150)
VLDL CALC: 42 mg/dL — ABNORMAL HIGH (ref 5–35)

## 2019-04-05 LAB — URIC ACID: URIC ACID: 8.5 mg/dL — ABNORMAL HIGH (ref 4.0–7.0)

## 2019-04-15 ENCOUNTER — Ambulatory Visit (INDEPENDENT_AMBULATORY_CARE_PROVIDER_SITE_OTHER): Payer: Self-pay | Admitting: FAMILY MEDICINE

## 2019-04-15 ENCOUNTER — Other Ambulatory Visit: Payer: Self-pay

## 2019-04-15 VITALS — BP 135/93 | HR 82 | Temp 98.5°F | Ht 76.0 in | Wt 260.0 lb

## 2019-04-15 DIAGNOSIS — Z0001 Encounter for general adult medical examination with abnormal findings: Secondary | ICD-10-CM

## 2019-04-15 DIAGNOSIS — E781 Pure hyperglyceridemia: Secondary | ICD-10-CM

## 2019-04-15 DIAGNOSIS — M109 Gout, unspecified: Secondary | ICD-10-CM

## 2019-04-15 MED ORDER — ALLOPURINOL 100 MG TABLET
100.0000 mg | ORAL_TABLET | Freq: Every day | ORAL | 3 refills | Status: DC
Start: 2019-04-15 — End: 2019-06-12

## 2019-04-15 MED ORDER — OMEGA 3-DHA-EPA-FISH OIL 1,000 MG (120 MG-180 MG) CAPSULE
2000.0000 mg | ORAL_CAPSULE | Freq: Two times a day (BID) | ORAL | Status: AC
Start: 2019-04-15 — End: ?

## 2019-04-15 NOTE — Nursing Note (Signed)
Chief Complaint            Annual Wellness Exam         PHQ Questionnaire        BP (!) 135/93   Pulse 82   Temp 36.9 C (98.5 F) (Oral)   Ht 1.93 m (6\' 4" )   Wt 118 kg (260 lb)   SpO2 97%   BMI 31.65 kg/m       [employer], MA

## 2019-04-16 DIAGNOSIS — E781 Pure hyperglyceridemia: Secondary | ICD-10-CM | POA: Insufficient documentation

## 2019-04-16 NOTE — Progress Notes (Signed)
[facility]  [address] [place] [address]  Phone: [phone]  Fax: [phone]    Encounter Date: 04/15/2019    Patient ID:  Benjamin Delgado  [id number]    DOB: 05/14/1971  Age: 48 y.o. male    Subjective:     Chief Complaint   Patient presents with   . Annual Wellness Exam     This is a pleasant 48 year old male here for wellness exam and lab review after recent establish care visit.  We were able to review the labs today including CMP lipid panel and uric acid level.  CMP is within normal limits.  Uric acid level is 8.5.  Patient does have a known history of gout.  Is not currently having any symptoms today.  Patient's lipid panel shows a total cholesterol of 173, LDL of 104, HDL of 27, triglyceride level of 208. Patient wire statin therapy at this time but would benefit from fish oil.  Will also start patient on allopurinol and plan to repeat uric acid and BMP in about 4 weeks.          Current Outpatient Medications   Medication Sig   . allopurinoL (ZYLOPRIM) 100 mg Oral Tablet Take 1 Tab (100 mg total) by mouth Once a day   . omega-3-DHA-EPA-fish oil (FISH OIL) 1,000 mg (120 mg-180 mg) Oral Capsule Take 2 Caps by mouth Twice daily     Allergies   Allergen Reactions   . Tetanus Vaccines And Toxoid Nausea/ Vomiting     Past Medical History:   Diagnosis Date   . Gout    . Kidney stone            Family Medical History:     Problem Relation (Age of Onset)    Cancer Mother (87)            Social History     Tobacco Use   . Smoking status: Never Smoker   . Smokeless tobacco: Never Used   Substance Use Topics   . Alcohol use: Never     Frequency: Never   . Drug use: Never       Review of Systems   Constitutional: Negative for appetite change and fever.   HENT: Negative for congestion and hearing loss.    Eyes: Negative for visual disturbance.   Respiratory: Negative for chest tightness and shortness of breath.    Cardiovascular: Negative for chest pain and leg swelling.      Gastrointestinal: Negative for blood in stool, diarrhea, nausea and vomiting.   Endocrine: Negative for polydipsia and polyuria.   Genitourinary: Negative for dysuria and hematuria.   Skin: Negative for rash.   Neurological: Negative for seizures and headaches.     Objective:   Vitals: BP (!) 135/93   Pulse 82   Temp 36.9 C (98.5 F) (Oral)   Ht 1.93 m (6\' 4" )   Wt 118 kg (260 lb)   SpO2 97%   BMI 31.65 kg/m         Physical Exam  Vitals signs and nursing note reviewed.   Constitutional:       Appearance: He is well-developed.   HENT:      Head: Normocephalic and atraumatic.   Eyes:      Conjunctiva/sclera: Conjunctivae normal.   Neck:      Musculoskeletal: Normal range of motion.      Vascular: No JVD.   Pulmonary:      Effort: Pulmonary effort is  normal. No respiratory distress.   Abdominal:      Palpations: Abdomen is soft.      Tenderness: There is no abdominal tenderness. There is no guarding.   Skin:     General: Skin is warm and dry.   Neurological:      Mental Status: He is alert and oriented to person, place, and time.   Psychiatric:         Behavior: Behavior normal.         Assessment & Plan:     ENCOUNTER DIAGNOSES     ICD-10-CM   1. Encounter for well adult exam with abnormal findings Z00.01   2. Gout, unspecified cause, unspecified chronicity, unspecified site M10.9   3. 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 BASIC METABOLIC PANEL   . URIC ACID   . omega-3-DHA-EPA-fish oil (FISH OIL) 1,000 mg (120 mg-180 mg) Oral Capsule   . allopurinoL (ZYLOPRIM) 100 mg Oral Tablet     This is a 48 year old male with    Gout   Start allopurinol 100 mg p.o. Daily   Repeat BMP in uric acid level in 4 weeks    Hypertriglyceridemia   Start fish oil 2 tabs p.o. B.i.d.   Plan to repeat CMP and lipid panel at a later date    Age-appropriate screening   CMP and lipid panel up-to-date    Plan to see patient back in 4 weeks for gout follow-up.    Cecilie LowersJason Bryce Rose Hippler, DO

## 2019-04-16 NOTE — Addendum Note (Signed)
Addended by: Eino Farber on: 04/16/2019 07:21 AM     Modules accepted: Level of Service

## 2019-04-19 ENCOUNTER — Encounter (INDEPENDENT_AMBULATORY_CARE_PROVIDER_SITE_OTHER): Payer: Self-pay | Admitting: FAMILY MEDICINE

## 2019-05-09 ENCOUNTER — Ambulatory Visit: Payer: BC Managed Care – PPO | Attending: FAMILY MEDICINE

## 2019-05-09 ENCOUNTER — Other Ambulatory Visit: Payer: Self-pay

## 2019-05-09 DIAGNOSIS — M109 Gout, unspecified: Secondary | ICD-10-CM | POA: Insufficient documentation

## 2019-05-09 LAB — BASIC METABOLIC PANEL
ANION GAP: 9 mmol/L (ref 3–11)
BUN/CREA RATIO: 23 — ABNORMAL HIGH (ref 6–22)
BUN: 26 mg/dL — ABNORMAL HIGH (ref 6–20)
CALCIUM: 9.2 mg/dL (ref 8.6–10.3)
CHLORIDE: 103 mmol/L (ref 101–111)
CO2 TOTAL: 25 mmol/L (ref 22–32)
CREATININE: 1.12 mg/dL (ref 0.61–1.24)
ESTIMATED GFR: >60 mL/min/1.73mˆ2 (ref >60)
GLUCOSE: 90 mg/dL (ref 70–110)
POTASSIUM: 4.3 mmol/L (ref 3.4–5.1)
SODIUM: 137 mmol/L (ref 136–145)

## 2019-05-09 LAB — URIC ACID: URIC ACID: 7.6 mg/dL — ABNORMAL HIGH (ref 4.0–7.0)

## 2019-05-24 ENCOUNTER — Encounter (INDEPENDENT_AMBULATORY_CARE_PROVIDER_SITE_OTHER): Payer: Self-pay | Admitting: FAMILY MEDICINE

## 2019-06-12 ENCOUNTER — Ambulatory Visit (INDEPENDENT_AMBULATORY_CARE_PROVIDER_SITE_OTHER): Payer: Self-pay | Admitting: FAMILY MEDICINE

## 2019-06-12 ENCOUNTER — Other Ambulatory Visit: Payer: Self-pay

## 2019-06-12 VITALS — BP 134/84 | HR 93 | Temp 98.3°F | Ht 76.0 in | Wt 259.0 lb

## 2019-06-12 DIAGNOSIS — M109 Gout, unspecified: Secondary | ICD-10-CM

## 2019-06-12 DIAGNOSIS — E781 Pure hyperglyceridemia: Secondary | ICD-10-CM

## 2019-06-12 MED ORDER — ALLOPURINOL 100 MG TABLET
200.0000 mg | ORAL_TABLET | Freq: Every day | ORAL | 3 refills | Status: DC
Start: 2019-06-12 — End: 2019-09-16

## 2019-06-12 NOTE — Nursing Note (Signed)
Chief Complaint            Gout     Lab Results         PHQ Questionnaire        BP 134/84   Pulse 93   Temp 36.8 C (98.3 F) (Oral)   Ht 1.93 m (6\' 4" )   Wt 117 kg (259 lb)   SpO2 96%   BMI 31.53 kg/m       [employer], MA

## 2019-06-13 NOTE — Progress Notes (Signed)
[facility]  [place] [address]  Phone: [phone]  Fax: [phone]    Encounter Date: 06/12/2019    Patient ID:  Benjamin Delgado  [id number]:[id number]    DOB: 09-20-1971  Age: 48 y.o. male    Subjective:     Chief Complaint   Patient presents with   . Gout   . Lab Results     This is a pleasant 48 year old male here for gout and hypertriglyceridemia follow-up.  Recent labs reveal uric acid level of 7.6 and normal BMP.  Patient also had lipid panel done in May of 2020 this showed elevated triglycerides at 2:08 a.m.Marland Kitchen  Patient is currently taking fish oil 2 g b.i.d..  Also taking allopurinol 100 mg p.o. Daily at this time.  Given the patient's elevated uric acid will go ahead and increase allopurinol to 200 mg daily.  Patient does report 1 episode of gout symptoms that was brief lasting less than 1 day, he states that he feels as if he had an early onset of flare but was fortunate enough not to have a full on attack.  Patient does report that he eats seafood on occasional basis.  Does not drink alcohol excessively.        Current Outpatient Medications   Medication Sig   . allopurinoL (ZYLOPRIM) 100 mg Oral Tablet Take 2 Tabs (200 mg total) by mouth Once a day   . omega-3-DHA-EPA-fish oil (FISH OIL) 1,000 mg (120 mg-180 mg) Oral Capsule Take 2 Caps by mouth Twice daily     Allergies   Allergen Reactions   . Tetanus Vaccines And Toxoid Nausea/ Vomiting     Past Medical History:   Diagnosis Date   . Gout    . Kidney stone                Family Medical History:     Problem Relation (Age of Onset)    Cancer Mother (36)            Social History     Tobacco Use   . Smoking status: Never Smoker   . Smokeless tobacco: Never Used   Substance Use Topics   . Alcohol use: Never     Frequency: Never   . Drug use: Never       Review of Systems   Constitutional: Negative for appetite change and fever.   HENT: Negative for congestion and hearing loss.    Eyes: Negative for visual disturbance.      Respiratory: Negative for chest tightness and shortness of breath.    Cardiovascular: Negative for chest pain and leg swelling.   Gastrointestinal: Negative for blood in stool, diarrhea, nausea and vomiting.   Endocrine: Negative for polydipsia and polyuria.   Genitourinary: Negative for dysuria and hematuria.   Skin: Negative for rash.   Neurological: Negative for seizures and headaches.     Objective:   Vitals: BP 134/84   Pulse 93   Temp 36.8 C (98.3 F) (Oral)   Ht 1.93 m (6\' 4" )   Wt 117 kg (259 lb)   SpO2 96%   BMI 31.53 kg/m         Physical Exam  Vitals signs and nursing note reviewed.   Constitutional:       Appearance: He is well-developed.   HENT:      Head: Normocephalic and atraumatic.   Eyes:      Conjunctiva/sclera: Conjunctivae normal.      Pupils:  Pupils are equal, round, and reactive to light.   Neck:      Musculoskeletal: Normal range of motion and neck supple.      Thyroid: No thyromegaly.      Vascular: No JVD.   Cardiovascular:      Rate and Rhythm: Normal rate and regular rhythm.      Heart sounds: No murmur. No friction rub. No gallop.    Pulmonary:      Effort: Pulmonary effort is normal. No respiratory distress.      Breath sounds: Normal breath sounds.   Abdominal:      General: Bowel sounds are normal.      Palpations: Abdomen is soft.      Tenderness: There is no abdominal tenderness. There is no guarding.   Musculoskeletal: Normal range of motion.   Lymphadenopathy:      Cervical: No cervical adenopathy.   Skin:     General: Skin is warm and dry.      Findings: No rash.   Neurological:      Mental Status: He is alert and oriented to person, place, and time.   Psychiatric:         Behavior: Behavior normal.         Assessment & Plan:     ENCOUNTER DIAGNOSES     ICD-10-CM   1. 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 Gout, unspecified cause, unspecified chronicity, unspecified site M10.9       Orders Placed This Encounter   . COMPREHENSIVE METABOLIC PNL, FASTING   . LIPID PANEL   . URIC  ACID   . allopurinoL (ZYLOPRIM) 100 mg Oral Tablet     This is a 48 year old male with    Hypertriglyceridemia   Continue fish oil 2 g p.o. B.i.d.   CMP and lipid panel prior to next appointment    Gout   Increase allopurinol to 200 mg p.o. Daily   Uric acid level prior to next appointment    Plan to see patient back in 2 months for Gout hyperlipidemia follow-up.  If doing well anticipate seeing patient twice annually.    Cecilie LowersJason Bryce Deshay Blumenfeld, DO

## 2019-07-04 ENCOUNTER — Ambulatory Visit (HOSPITAL_BASED_OUTPATIENT_CLINIC_OR_DEPARTMENT_OTHER): Payer: Self-pay | Admitting: Family Medicine

## 2019-08-07 ENCOUNTER — Encounter (INDEPENDENT_AMBULATORY_CARE_PROVIDER_SITE_OTHER): Payer: Self-pay | Admitting: FAMILY MEDICINE

## 2019-08-16 ENCOUNTER — Other Ambulatory Visit (INDEPENDENT_AMBULATORY_CARE_PROVIDER_SITE_OTHER): Payer: Self-pay | Admitting: FAMILY MEDICINE

## 2019-08-16 ENCOUNTER — Telehealth (INDEPENDENT_AMBULATORY_CARE_PROVIDER_SITE_OTHER): Payer: Self-pay | Admitting: FAMILY MEDICINE

## 2019-08-16 NOTE — Telephone Encounter (Signed)
PT requesting anti-inflammatory medication be called in for his gout. He is unable to come in for appointment. He is in [facility] currently for work.   Pharmacy is:  [facility] :   [place] [address]    Elmarie Mainland

## 2019-09-13 ENCOUNTER — Other Ambulatory Visit: Payer: Self-pay

## 2019-09-13 ENCOUNTER — Ambulatory Visit: Payer: BC Managed Care – PPO | Attending: FAMILY MEDICINE

## 2019-09-13 DIAGNOSIS — M109 Gout, unspecified: Secondary | ICD-10-CM | POA: Insufficient documentation

## 2019-09-13 DIAGNOSIS — E781 Pure hyperglyceridemia: Secondary | ICD-10-CM | POA: Insufficient documentation

## 2019-09-13 LAB — COMPREHENSIVE METABOLIC PNL, FASTING
ALBUMIN/GLOBULIN RATIO: 1.6 (ref 0.8–2.0)
ALBUMIN: 4.2 g/dL (ref 3.5–5.0)
ALKALINE PHOSPHATASE: 46 U/L (ref 38–126)
ALT (SGPT): 78 U/L — ABNORMAL HIGH (ref 17–63)
ANION GAP: 8 mmol/L (ref 3–11)
AST (SGOT): 58 U/L — ABNORMAL HIGH (ref 15–41)
BILIRUBIN TOTAL: 0.9 mg/dL (ref 0.3–1.2)
BUN/CREA RATIO: 11 (ref 6–22)
BUN: 13 mg/dL (ref 6–20)
CALCIUM: 9.6 mg/dL (ref 8.6–10.3)
CHLORIDE: 102 mmol/L (ref 101–111)
CO2 TOTAL: 27 mmol/L (ref 22–32)
CREATININE: 1.15 mg/dL (ref 0.61–1.24)
ESTIMATED GFR: >60 mL/min/1.73mˆ2 (ref >60)
GLUCOSE: 100 mg/dL (ref 70–110)
POTASSIUM: 5 mmol/L (ref 3.4–5.1)
PROTEIN TOTAL: 6.9 g/dL (ref 6.4–8.3)
SODIUM: 137 mmol/L (ref 136–145)

## 2019-09-13 LAB — LIPID PANEL
CHOL/HDL RATIO: 6.6
CHOLESTEROL: 165 mg/dL (ref 120–199)
HDL CHOL: 25 mg/dL — ABNORMAL LOW (ref >39)
LDL CALC: 99 mg/dL (ref <=130)
TRIGLYCERIDES: 206 mg/dL — ABNORMAL HIGH (ref <150)
VLDL CALC: 41 mg/dL — ABNORMAL HIGH (ref 5–35)

## 2019-09-13 LAB — URIC ACID: URIC ACID: 7.6 mg/dL — ABNORMAL HIGH (ref 4.0–7.0)

## 2019-09-16 ENCOUNTER — Ambulatory Visit (INDEPENDENT_AMBULATORY_CARE_PROVIDER_SITE_OTHER): Payer: BC Managed Care – PPO | Admitting: FAMILY MEDICINE

## 2019-09-16 ENCOUNTER — Other Ambulatory Visit: Payer: Self-pay

## 2019-09-16 VITALS — BP 146/101 | HR 73 | Temp 95.1°F | Ht 76.0 in | Wt 264.0 lb

## 2019-09-16 DIAGNOSIS — E781 Pure hyperglyceridemia: Secondary | ICD-10-CM

## 2019-09-16 DIAGNOSIS — R7309 Other abnormal glucose: Secondary | ICD-10-CM

## 2019-09-16 DIAGNOSIS — M109 Gout, unspecified: Secondary | ICD-10-CM

## 2019-09-16 DIAGNOSIS — Z87442 Personal history of urinary calculi: Secondary | ICD-10-CM

## 2019-09-16 LAB — POCT HGB A1C: POCT HGB A1C: 5.3 % (ref 4–6)

## 2019-09-16 MED ORDER — ALLOPURINOL 300 MG TABLET
300.0000 mg | ORAL_TABLET | Freq: Every day | ORAL | 4 refills | Status: DC
Start: 2019-09-16 — End: 2020-02-10

## 2019-09-16 MED ORDER — ATORVASTATIN 10 MG TABLET
10.0000 mg | ORAL_TABLET | Freq: Every day | ORAL | 4 refills | Status: DC
Start: 2019-09-16 — End: 2020-02-10

## 2019-09-16 NOTE — Patient Instructions (Signed)
Blood Pressure Record     Systolic  Diastolic     Date Day Time Pressure Pressure Heart Rate                                                                                                                                                                                                                                                                                                                                       Please record your blood pressures and heart rate twice daily for the next two weeks and return them to Dr. Lakin Romer at [facility]

## 2019-09-16 NOTE — Progress Notes (Signed)
[place]  [place] [address]  Phone: [phone]  Fax: [phone]    Encounter Date: 09/16/2019    Patient ID:  Benjamin Delgado  IRS:[id number]    DOB: 1971-02-18  Age: 48 y.o. male    Subjective:     Chief Complaint   Patient presents with   . Lab Results     [name] is a 48yo male here for follow-up on gout and hyperlipidemia. Recent labs reveal HDL 25, triglycerides 206, uric acid 7.6, ALT 78, AST 58. Patient is currently taking allopurinol 200mg  PO daily and fish oil 2g PO BID. A1c in office today is 5.3. In the past few months he has had three gout flare-ups, all occurring in his left foot. Last flare-up was beginning of October. He states he was at [facility] for these past few flare-ups and that he was eating a lot of seafood and not drinking enough water. Denies alcohol and soda intake. He tried NSAIDs for two of the three flare-ups which provided pain relief. Patient is not having any gout pain today.  Patient's blood pressure in office today is 150/100, repeat 146/101. States that today being Election Day has been very stressful for him. Patient does not monitor blood pressure at home.         Current Outpatient Medications   Medication Sig   . allopurinoL (ZYLOPRIM) 300 mg Oral Tablet Take 1 Tab (300 mg total) by mouth Once a day   . atorvastatin (LIPITOR) 10 mg Oral Tablet Take 1 Tab (10 mg total) by mouth Once a day   . omega-3-DHA-EPA-fish oil (FISH OIL) 1,000 mg (120 mg-180 mg) Oral Capsule Take 2 Caps by mouth Twice daily     Allergies   Allergen Reactions   . Tetanus Vaccines And Toxoid Nausea/ Vomiting     Past Medical History:   Diagnosis Date   . Gout    . Kidney stone              Family Medical History:     Problem Relation (Age of Onset)    Cancer Mother (25)            Social History     Tobacco Use   . Smoking status: Never Smoker   . Smokeless tobacco: Never Used   Substance Use Topics   . Alcohol use: Never     Frequency: Never   . Drug  use: Never       Review of Systems   Constitutional: Negative for appetite change and fever.   HENT: Negative for congestion and hearing loss.    Eyes: Negative for visual disturbance.   Respiratory: Negative for chest tightness and shortness of breath.    Cardiovascular: Negative for chest pain and leg swelling.   Gastrointestinal: Negative for blood in stool, diarrhea, nausea and vomiting.   Endocrine: Negative for polydipsia and polyuria.   Genitourinary: Negative for dysuria and hematuria.   Skin: Negative for rash.   Neurological: Negative for seizures and headaches.     Objective:   Vitals: BP (!) 146/101 (Site: Right, Patient Position: Sitting, Cuff Size: Adult Large)   Pulse 73   Temp 35.1 C (95.1 F) (Thermal Scan)   Ht 1.93 m (6\' 4" )   Wt 120 kg (264 lb)   SpO2 98%   BMI 32.14 kg/m         Physical Exam  Vitals signs and nursing note reviewed.   Constitutional:  Appearance: He is well-developed.   HENT:      Head: Normocephalic and atraumatic.   Eyes:      Conjunctiva/sclera: Conjunctivae normal.      Pupils: Pupils are equal, round, and reactive to light.   Neck:      Musculoskeletal: Normal range of motion and neck supple.      Thyroid: No thyromegaly.      Vascular: No JVD.   Cardiovascular:      Rate and Rhythm: Normal rate and regular rhythm.      Heart sounds: No murmur. No friction rub. No gallop.    Pulmonary:      Effort: Pulmonary effort is normal. No respiratory distress.      Breath sounds: Normal breath sounds.   Abdominal:      General: Bowel sounds are normal.      Palpations: Abdomen is soft.      Tenderness: There is no abdominal tenderness. There is no guarding.   Musculoskeletal: Normal range of motion.   Lymphadenopathy:      Cervical: No cervical adenopathy.   Skin:     General: Skin is warm and dry.      Findings: No rash.   Neurological:      Mental Status: He is alert and oriented to person, place, and time.   Psychiatric:         Behavior: Behavior normal.                  Assessment & Plan:     ENCOUNTER DIAGNOSES     ICD-10-CM   1. Gout, unspecified cause, unspecified chronicity, unspecified site  M10.9   2. 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 History of renal calculi  Z87.442   4. Elevated glucose  R73.09       Orders Placed This Encounter   . COMPREHENSIVE METABOLIC PNL, FASTING   . LIPID PANEL   . URIC ACID   . POCT HGB A1C   . atorvastatin (LIPITOR) 10 mg Oral Tablet   . allopurinoL (ZYLOPRIM) 300 mg Oral Tablet     This is a 48yo male with    Gout   Increase allopurinol to 300mg  PO daily   Continue to limit seafood and stay hydrated   Uric acid prior to next appointment    Hyperlipidemia hypertriglyceridemia   Start Lipitor 10mg  PO daily   Continue fish oil 2g PO BID   CMP lipid panel prior to next appointment    Elevated blood pressure in office   Drop off blood pressure log in 2 weeks    Elevated glucose   A1c in office today is 5.3.    Plan to see patient back in 2 months. Complete labs prior to next visit - uric acid, CMP, lipid panel.      , MED STUDENT    Patient seen in conjunction with student.  Any addendums have been made.    Janace Decker, DO  09/18/2019, 12:58

## 2019-09-16 NOTE — Nursing Note (Signed)
Chief Complaint            Lab Results         PHQ Questionnaire        BP (!) 146/101 (Site: Right, Patient Position: Sitting, Cuff Size: Adult Large)   Pulse 73   Temp 35.1 C (95.1 F) (Thermal Scan)   Ht 1.93 m (6\' 4" )   Wt 120 kg (264 lb)   SpO2 98%   BMI 32.14 kg/m       [employer], MA

## 2019-11-18 ENCOUNTER — Encounter (INDEPENDENT_AMBULATORY_CARE_PROVIDER_SITE_OTHER): Payer: Self-pay | Admitting: FAMILY MEDICINE

## 2020-01-31 ENCOUNTER — Other Ambulatory Visit: Payer: Self-pay

## 2020-01-31 ENCOUNTER — Ambulatory Visit: Payer: BC Managed Care – PPO | Attending: FAMILY MEDICINE

## 2020-01-31 DIAGNOSIS — M109 Gout, unspecified: Secondary | ICD-10-CM

## 2020-01-31 DIAGNOSIS — E781 Pure hyperglyceridemia: Secondary | ICD-10-CM | POA: Insufficient documentation

## 2020-01-31 LAB — COMPREHENSIVE METABOLIC PNL, FASTING
ALBUMIN/GLOBULIN RATIO: 1.8 (ref 0.8–2.0)
ALBUMIN: 4.5 g/dL (ref 3.5–5.0)
ALKALINE PHOSPHATASE: 52 U/L (ref 38–126)
ALT (SGPT): 60 U/L (ref 17–63)
ANION GAP: 11 mmol/L (ref 3–11)
AST (SGOT): 51 U/L — ABNORMAL HIGH (ref 15–41)
BILIRUBIN TOTAL: 1.1 mg/dL (ref 0.3–1.2)
BUN/CREA RATIO: 13 (ref 6–22)
BUN: 16 mg/dL (ref 6–20)
CALCIUM: 9.8 mg/dL (ref 8.6–10.3)
CHLORIDE: 101 mmol/L (ref 101–111)
CO2 TOTAL: 28 mmol/L (ref 22–32)
CREATININE: 1.24 mg/dL (ref 0.61–1.24)
ESTIMATED GFR: >60 mL/min/1.73mˆ2 (ref >60)
GLUCOSE: 100 mg/dL (ref 70–110)
POTASSIUM: 4.6 mmol/L (ref 3.4–5.1)
PROTEIN TOTAL: 7 g/dL (ref 6.4–8.3)
SODIUM: 140 mmol/L (ref 136–145)

## 2020-01-31 LAB — LIPID PANEL
CHOL/HDL RATIO: 5.7
CHOLESTEROL: 131 mg/dL (ref 120–199)
HDL CHOL: 23 mg/dL — ABNORMAL LOW (ref >39)
LDL CALC: 66 mg/dL (ref <=130)
TRIGLYCERIDES: 212 mg/dL — ABNORMAL HIGH (ref <150)
VLDL CALC: 42 mg/dL — ABNORMAL HIGH (ref 5–35)

## 2020-01-31 LAB — URIC ACID: URIC ACID: 6.6 mg/dL (ref 4.0–7.0)

## 2020-02-10 ENCOUNTER — Ambulatory Visit (INDEPENDENT_AMBULATORY_CARE_PROVIDER_SITE_OTHER): Payer: BC Managed Care – PPO | Admitting: FAMILY MEDICINE

## 2020-02-10 ENCOUNTER — Encounter (INDEPENDENT_AMBULATORY_CARE_PROVIDER_SITE_OTHER): Payer: Self-pay | Admitting: FAMILY MEDICINE

## 2020-02-10 ENCOUNTER — Other Ambulatory Visit: Payer: Self-pay

## 2020-02-10 VITALS — BP 123/82 | HR 95 | Temp 97.7°F | Ht 76.0 in | Wt 262.4 lb

## 2020-02-10 DIAGNOSIS — H539 Unspecified visual disturbance: Secondary | ICD-10-CM

## 2020-02-10 DIAGNOSIS — M25512 Pain in left shoulder: Secondary | ICD-10-CM

## 2020-02-10 DIAGNOSIS — M25511 Pain in right shoulder: Secondary | ICD-10-CM

## 2020-02-10 DIAGNOSIS — E781 Pure hyperglyceridemia: Secondary | ICD-10-CM

## 2020-02-10 DIAGNOSIS — M109 Gout, unspecified: Secondary | ICD-10-CM

## 2020-02-10 DIAGNOSIS — M7918 Myalgia, other site: Secondary | ICD-10-CM

## 2020-02-10 DIAGNOSIS — T466X5A Adverse effect of antihyperlipidemic and antiarteriosclerotic drugs, initial encounter: Secondary | ICD-10-CM

## 2020-02-10 MED ORDER — ALLOPURINOL 100 MG TABLET
200.0000 mg | ORAL_TABLET | Freq: Every day | ORAL | 3 refills | Status: AC
Start: 2020-02-10 — End: ?

## 2020-02-10 NOTE — Nursing Note (Signed)
Chief Complaint:   Chief Complaint            Elevated Trigylcerides     Gout         Functional Health Screen        Vital Signs  BP 123/82   Pulse 95   Temp 36.5 C (97.7 F) (Thermal Scan)   Ht 1.93 m (6\' 4" )   Wt 119 kg (262 lb 6.4 oz)   SpO2 96%   BMI 31.94 kg/m       Social History     Tobacco Use   Smoking Status Never Smoker   Smokeless Tobacco Never Used     Allergies  Allergies   Allergen Reactions   . Tetanus Vaccines And Toxoid Nausea/ Vomiting     Medication History  Reviewed for OTC medication and any new medications, provider will review medication history  Care Team  Patient Care Team:  Swalm, , DO as PCP - General (FAMILY MEDICINE)  Immunizations - last 24 hours     None        Cecilie Lowers, Shelly Rubenstein  02/10/2020, 16:20

## 2020-02-10 NOTE — Progress Notes (Signed)
F[facility]ICINE, [facility]  [address]  [place] [address]  Phone: [phone]  Fax: [phone]    Encounter Date: 02/10/2020    Patient ID:  Benjamin Delgado  VFI:[id number]    DOB: October 26, 1971  Age: 49 y.o. male    Subjective:     Chief Complaint   Patient presents with   . Elevated Trigylcerides   . Gout     Benjamin Delgado is a pleasant 49 y.o. male here for follow-up with gout and hyperlipidemia. Recent labs were reviewed with patient. CBC and CMP unremarkable. HDL 23, triglycerides 212, uric acid 6.6.  Patient is currently taking allopurinol 300mg  p.o. daily. No recent flare ups, however patient states he has noticed decreased libido. Patient continues to take fish oil but has discontinued Lipitor due to visual disturbances and muscle aches/joint pains, mostly in his shoulders. Plan to see patient back in 6 months. Review CMP lipid panel at that time.         Current Outpatient Medications   Medication Sig   . allopurinoL (ZYLOPRIM) 100 mg Oral Tablet Take 2 Tablets (200 mg total) by mouth Once a day   . omega-3-DHA-EPA-fish oil (FISH OIL) 1,000 mg (120 mg-180 mg) Oral Capsule Take 2 Caps by mouth Twice daily     Allergies   Allergen Reactions   . Tetanus Vaccines And Toxoid Nausea/ Vomiting     Past Medical History:   Diagnosis Date   . Gout    . Kidney stone            Family Medical History:     Problem Relation (Age of Onset)    Cancer Mother (6)            Social History     Tobacco Use   . Smoking status: Never Smoker   . Smokeless tobacco: Never Used   Substance Use Topics   . Alcohol use: Never   . Drug use: Never       Review of Systems   Constitutional: Negative for appetite change and fever.   HENT: Negative for congestion and hearing loss.    Eyes: Negative for visual disturbance.   Respiratory: Negative for chest tightness and shortness of breath.    Cardiovascular: Negative for chest pain and leg swelling.   Gastrointestinal: Negative for blood in stool, diarrhea,  nausea and vomiting.   Endocrine: Negative for polydipsia and polyuria.   Genitourinary: Negative for dysuria and hematuria.   Skin: Negative for rash.   Neurological: Negative for seizures and headaches.     Objective:   Vitals: BP 123/82   Pulse 95   Temp 36.5 C (97.7 F) (Thermal Scan)   Ht 1.93 m (6\' 4" )   Wt 119 kg (262 lb 6.4 oz)   SpO2 96%   BMI 31.94 kg/m         Physical Exam  Vitals and nursing note reviewed.   Constitutional:       Appearance: He is well-developed.   HENT:      Head: Normocephalic and atraumatic.   Eyes:      Conjunctiva/sclera: Conjunctivae normal.      Pupils: Pupils are equal, round, and reactive to light.   Neck:      Thyroid: No thyromegaly.      Vascular: No JVD.   Cardiovascular:      Rate and Rhythm: Normal rate and regular rhythm.      Heart sounds: No murmur heard.   No  friction rub. No gallop.    Pulmonary:      Effort: Pulmonary effort is normal. No respiratory distress.      Breath sounds: Normal breath sounds.   Abdominal:      General: Bowel sounds are normal.      Palpations: Abdomen is soft.      Tenderness: There is no abdominal tenderness. There is no guarding.   Musculoskeletal:         General: Normal range of motion.      Cervical back: Normal range of motion and neck supple.   Lymphadenopathy:      Cervical: No cervical adenopathy.   Skin:     General: Skin is warm and dry.      Findings: No rash.   Neurological:      Mental Status: He is alert and oriented to person, place, and time.   Psychiatric:         Behavior: Behavior normal.         Assessment & Plan:     ENCOUNTER DIAGNOSES     ICD-10-CM   1. 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 Gout, unspecified cause, unspecified chronicity, unspecified site  M10.9        Orders Placed This Encounter   . COMPREHENSIVE METABOLIC PANEL, NON-FASTING   . LIPID PANEL   . allopurinoL (ZYLOPRIM) 100 mg Oral Tablet       This is a 49 y.o. male with    Gout                Most recent uric acid, 6.6     Decrease allopurinol  from 300mg  to 200 mg p.o. daily                   Continue to limit seafood and stay hydrated    Hyperlipidemia, hypertriglyceridemia                Discontinue Lipitor 10mg  due to adverse effects                Continue fish oil 2g p.o. b.i.d.                CMP lipid panel prior to next appointment      Plan to see patient back in 6 months for gout and hyperlipidemia follow up.     , MED STUDENT    I have seen and examined the patient in concert with the medical student and resident as noted above.  The above note has been modified as necessary by me to reflect my own, personal collection/validation of the patient's HPI, patient history, physical examination, and assessment and plan.  Benjamin Sunderland, DO

## 2020-08-10 ENCOUNTER — Encounter (INDEPENDENT_AMBULATORY_CARE_PROVIDER_SITE_OTHER): Payer: Self-pay | Admitting: FAMILY MEDICINE

## 2020-10-29 ENCOUNTER — Ambulatory Visit (HOSPITAL_BASED_OUTPATIENT_CLINIC_OR_DEPARTMENT_OTHER): Payer: Self-pay

## 2020-10-29 ENCOUNTER — Ambulatory Visit: Payer: BC Managed Care – PPO | Attending: INFECTIOUS DISEASE

## 2020-10-29 DIAGNOSIS — R059 Cough, unspecified: Secondary | ICD-10-CM

## 2020-10-29 DIAGNOSIS — Z20828 Contact with and (suspected) exposure to other viral communicable diseases: Secondary | ICD-10-CM

## 2020-10-29 DIAGNOSIS — U071 COVID-19: Secondary | ICD-10-CM | POA: Insufficient documentation

## 2020-10-29 LAB — COVID-19 PANTHER - LAB USE ONLY: SARS-CoV-2: DETECTED — AB

## 2020-10-29 NOTE — Telephone Encounter (Signed)
Coronavirus Medical Triage  Patient Name: Benjamin Delgado    Disposition: Demographics reviewed. Referred for testing at [facility] testing site. Instructed on self-quarantine guidelines per CDC recommendations. Home care advice given.  Encouraged patient to monitor symptoms and if any worsening concerns, to call back or report to the ED or UC. Take respiratory and hand hygiene precautions. Notified that results will be posted to [facility] My Chart account (if applicable). Understanding voiced.          DOB: August 21, 1971  Age: 49 y.o.      Telephone #:   Home Phone [phone]   Work Phone [phone]   Mobile [phone]       Are you a teacher/student/school employee: no   Interviewer Name: Kenard Gower, RN           A) Do you have any of the following?     Fever? no   Chills? Yes, and hot flashes   Cough? yes   Shortness of breath or Difficulty Breathing? no   Repeated shaking with chills? no   Muscle Pain? Yes, shoulder and back pain   Headache? no   Sore Throat? yes   New loss of taste or smell? Yes, taste   Nausea, Vomiting, or Diarrhea? Yes, nausea   If yes proceed to B    If no, proceed to C   Symptoms start date: x 5 days ago    Are you taking Tylenol? no, "bayer body aches"  Is your fever responding? N\A       B) Do you have any of the following medical problems:     Diabetes no   65 or Older: no   Heart Disease no   Immunosuppression no   Chronic lung Disease no   Chronic Kidney Disease no   HIV no   Are you currently pregnant? N\A   If no proceed to C)    If yes patient meets criteria for testing at either tent or urgent care/respiratory clinic       C) Have you had any contact with anyone who is a suspect person under investigation or a confirmed case of COVID - 19? no, pt states he tested positive via at-home COVID test kit on 12/16    Have you had any recent travel?  yes     If yes, patient meets criteria for testing at either tent or urgent care/respiratory clinic.        Please ask  the patient if they feel safe presenting to the tent. If you feel that the person is not safe for home management until test returns please call the ED and tell them of the patient's concern status and ask the patient to call before entering.        1. [facility] ED- [phone]  2. [facility] ED- [phone]       Educated patient on importance of hand hygiene, respiratory etiquette (covering mouth and nose when coughing/sneezing, wearing a mask if possible), drink plenty of fluids, rest and avoid contact with sick individuals. Advised patient if symptoms worsen or change to call the COVID-19 triage line and/or the emergency room after hours.    If the patient is to go home with home quarantine then you should cover the following:  Assess the Suitability of the Residential Setting for Home Care  a. In consultation with state or local health department staff, a healthcare professional should assess whether the residential setting is appropriate for home care. Considerations for care at home  include whether:  b. The patient is stable enough to receive care at home.  c. Appropriate caregivers are available at home.  d. There is a separate bedroom where the patient can recover without sharing immediate space with others.  e. Resources for access to food and other necessities are available.  f. The patient and other household members have access to appropriate, recommended personal protective equipment (at a minimum, gloves and facemask) and are capable of adhering to precautions recommended as part of home care or isolation (e.g., respiratory hygiene and cough etiquette, hand hygiene);  g. There are household members who may be at increased risk of complications from QQIWL-79 infection (.e.g., people >23 years old, young children, pregnant women, people who are immunocompromised or who have chronic heart, lung, or kidney conditions).  [email or web address]

## 2020-12-07 ENCOUNTER — Encounter (INDEPENDENT_AMBULATORY_CARE_PROVIDER_SITE_OTHER): Payer: BC Managed Care – PPO | Admitting: FAMILY MEDICINE

## 2024-04-01 ENCOUNTER — Encounter (INDEPENDENT_AMBULATORY_CARE_PROVIDER_SITE_OTHER): Payer: Self-pay
